# Patient Record
Sex: Male | Born: 1955 | Race: White | Hispanic: No | Marital: Married
Health system: Southern US, Community
[De-identification: ages and names within clinical notes are randomized; demographics above are authoritative.]

## PROBLEM LIST (undated history)

## (undated) ENCOUNTER — Encounter

## (undated) ENCOUNTER — Encounter: Attending: Gastroenterology | Primary: Gastroenterology

## (undated) ENCOUNTER — Ambulatory Visit: Attending: Surgery | Primary: Surgery

## (undated) ENCOUNTER — Telehealth

## (undated) ENCOUNTER — Ambulatory Visit: Payer: BLUE CROSS/BLUE SHIELD

## (undated) ENCOUNTER — Ambulatory Visit: Payer: PRIVATE HEALTH INSURANCE | Attending: Gastroenterology | Primary: Gastroenterology

## (undated) ENCOUNTER — Ambulatory Visit

## (undated) ENCOUNTER — Ambulatory Visit
Attending: Student in an Organized Health Care Education/Training Program | Primary: Student in an Organized Health Care Education/Training Program

## (undated) ENCOUNTER — Encounter
Attending: Student in an Organized Health Care Education/Training Program | Primary: Student in an Organized Health Care Education/Training Program

## (undated) ENCOUNTER — Telehealth
Attending: Student in an Organized Health Care Education/Training Program | Primary: Student in an Organized Health Care Education/Training Program

## (undated) ENCOUNTER — Ambulatory Visit: Payer: PRIVATE HEALTH INSURANCE

## (undated) ENCOUNTER — Ambulatory Visit: Payer: BLUE CROSS/BLUE SHIELD | Attending: Gastroenterology | Primary: Gastroenterology

---

## 2018-03-01 ENCOUNTER — Emergency Department: Payer: BLUE CROSS/BLUE SHIELD

## 2018-03-01 ENCOUNTER — Emergency Department
Admission: EM | Admit: 2018-03-01 | Discharge: 2018-03-01 | Disposition: A | Discharge status: Home / Self Care | Payer: BLUE CROSS/BLUE SHIELD | Attending: Emergency Medicine | Admitting: Emergency Medicine

## 2018-03-01 ENCOUNTER — Other Ambulatory Visit: Payer: Self-pay

## 2018-03-01 DIAGNOSIS — R42 Dizziness and giddiness: Secondary | ICD-10-CM | POA: Diagnosis not present

## 2018-03-01 LAB — CBC WITH DIFFERENTIAL/PLATELET
BASOS ABS: 0.1 10*3/uL (ref 0–0.1)
BASOS PCT: 1 %
EOS ABS: 0.3 10*3/uL (ref 0–0.7)
Eosinophils Relative: 2 %
HCT: 43 % (ref 40.0–52.0)
HEMOGLOBIN: 14.9 g/dL (ref 13.0–18.0)
Lymphocytes Relative: 27 %
Lymphs Abs: 4.3 10*3/uL — ABNORMAL HIGH (ref 1.0–3.6)
MCH: 32.5 pg (ref 26.0–34.0)
MCHC: 34.5 g/dL (ref 32.0–36.0)
MCV: 94.2 fL (ref 80.0–100.0)
MONOS PCT: 8 %
Monocytes Absolute: 1.4 10*3/uL — ABNORMAL HIGH (ref 0.2–1.0)
NEUTROS PCT: 62 %
Neutro Abs: 10.2 10*3/uL — ABNORMAL HIGH (ref 1.4–6.5)
Platelets: 340 10*3/uL (ref 150–440)
RBC: 4.57 MIL/uL (ref 4.40–5.90)
RDW: 13.3 % (ref 11.5–14.5)
WBC: 16.2 10*3/uL — AB (ref 3.8–10.6)

## 2018-03-01 LAB — GLUCOSE, CAPILLARY: GLUCOSE-CAPILLARY: 110 mg/dL — AB (ref 70–99)

## 2018-03-01 LAB — BASIC METABOLIC PANEL
ANION GAP: 7 (ref 5–15)
BUN: 18 mg/dL (ref 6–20)
CALCIUM: 9.1 mg/dL (ref 8.9–10.3)
CO2: 25 mmol/L (ref 22–32)
CREATININE: 0.99 mg/dL (ref 0.61–1.24)
Chloride: 107 mmol/L (ref 98–111)
GFR calc non Af Amer: >60 mL/min (ref >60)
Glucose, Bld: 125 mg/dL — ABNORMAL HIGH (ref 70–99)
Potassium: 3.7 mmol/L (ref 3.5–5.1)
SODIUM: 139 mmol/L (ref 135–145)

## 2018-03-01 LAB — TROPONIN I

## 2018-03-01 MED ORDER — ONDANSETRON 4 MG PO TBDP: 4.0000 mg | ORAL_TABLET | Freq: Three times a day (TID) | ORAL | 0 refills | Status: AC | PRN

## 2018-03-01 MED ORDER — DIAZEPAM 5 MG/ML IJ SOLN: 2.5000 mg | Freq: Once | INTRAMUSCULAR | Status: DC

## 2018-03-01 MED ORDER — MECLIZINE HCL 25 MG PO TABS: 25.0000 mg | ORAL_TABLET | Freq: Three times a day (TID) | ORAL | 0 refills | Status: AC | PRN

## 2018-03-01 MED ORDER — MECLIZINE HCL 25 MG PO TABS
25.0000 mg | ORAL_TABLET | Freq: Once | ORAL | Status: AC
  Administered 2018-03-01: 25 mg via ORAL
  Filled 2018-03-01: qty 1

## 2018-03-01 MED ORDER — ONDANSETRON HCL 4 MG/2ML IJ SOLN
INTRAMUSCULAR | Status: AC
  Administered 2018-03-01: 4 mg
  Filled 2018-03-01: qty 2

## 2018-03-01 MED ORDER — SODIUM CHLORIDE 0.9 % IV BOLUS
1000.0000 mL | Freq: Once | INTRAVENOUS | Status: AC
  Administered 2018-03-01: 1000 mL via INTRAVENOUS

## 2018-03-01 MED ORDER — DIAZEPAM 2 MG PO TABS
2.0000 mg | ORAL_TABLET | Freq: Once | ORAL | Status: AC
  Administered 2018-03-01: 2 mg via ORAL
  Filled 2018-03-01: qty 1

## 2018-03-01 NOTE — ED Notes (Signed)
Pt up and ambulated with no difficulty or dizziness at this time.

## 2018-03-01 NOTE — ED Provider Notes (Signed)
[facility] Emergency Department Provider Note  ____________________________________________  Time seen: Approximately 7:21 PM  I have reviewed the triage vital signs and the nursing notes.   HISTORY  Chief Complaint Dizziness   HPI Aaron Saunders is a 62 y.o. male with history of Crohn's disease on monthly Remicade infusions who presents for evaluation of dizziness.  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  While driving he developed sudden onset of room spinning sensation associated with nausea, several episodes of vomiting, and severe diaphoresis.  Patient denies headache or chest pain, shortness of breath or palpitations.  Patient reports similar episode on July 1 which led to a 2-day hospitalization in [place].  Patient underwent carotid ultrasound, MRI, CT, cardiac evaluation, and Holter monitoring all which were within normal limits.  Patient does have a history of left-sided sensorineural hearing loss and his symptoms were attributed to vertigo.  Patient reports history of tinnitus however his symptoms have been worse over the last month on the left side.  He denies dysarthria, dysphasia, diplopia, unilateral weakness or numbness.  At this time he continues to complain of dizziness and reports that he feels like being on a boat and off balance.  Patient denies history of smoking.  No family history of stroke or heart disease.  PMH Crohn's disease  Allergies Amoxicillin and Sulfur  FH Cancer - mother  Social History Smoking - never Alcohol - occasional Drugs - occasional cannabis  Review of Systems  Constitutional: Negative for fever. Eyes: Negative for visual changes. ENT: Negative for sore throat. Neck: No neck pain  Cardiovascular: Negative for chest pain. Respiratory: Negative for shortness of breath. Gastrointestinal: Negative for abdominal pain,  vomiting or diarrhea. Genitourinary: Negative for dysuria. Musculoskeletal: Negative for back pain. Skin: Negative for rash. Neurological: Negative for headaches, weakness or numbness. + vertigo Psych: No SI or HI  ____________________________________________   PHYSICAL EXAM:  VITAL SIGNS: ED Triage Vitals [03/01/18 1856]  Enc Vitals Group     BP 117/68     Pulse Rate 67     Resp      Temp 97.6 F (36.4 C)     Temp Source Oral     SpO2 96 %     Weight 210 lb (95.3 kg)     Height 6\' 2"  (1.88 m)     Head Circumference      Peak Flow      Pain Score 0     Pain Loc      Pain Edu?      Excl. in GC?     Constitutional: Alert and oriented. Well appearing and in no apparent distress. HEENT:      Head: Normocephalic and atraumatic.         Eyes: Conjunctivae are normal. Sclera is non-icteric.        Ears: TMs visualized bilaterally      Mouth/Throat: Mucous membranes are moist.       Neck: Supple with no signs of meningismus. Cardiovascular: Regular rate and rhythm. No murmurs, gallops, or rubs. 2+ symmetrical distal pulses are present in all extremities. No JVD. Respiratory: Normal respiratory effort. Lungs are clear to auscultation bilaterally. No wheezes, crackles, or rhonchi.  Gastrointestinal: Soft, non tender, and non distended with positive bowel sounds. No rebound or guarding. Musculoskeletal: Nontender with normal range of motion in all extremities. No edema, cyanosis, or erythema  of extremities. Neurologic: Normal speech and language. A & O x3, PERRL, EOMI, unidirectional horizontal nystagmus, CN II-XII intact, motor testing reveals good tone and bulk throughout. There is no evidence of pronator drift or dysmetria. Muscle strength is 5/5 throughout. Sensory examination is intact. Gait is ataxic. Skin: Skin is warm, dry and intact. No rash noted. Psychiatric: Mood and affect are normal. Speech and behavior are normal.  ____________________________________________     LABS (all labs ordered are listed, but only abnormal results are displayed)  Labs Reviewed  GLUCOSE, CAPILLARY - Abnormal; Notable for the following components:      Result Value   Glucose-Capillary 110 (*)    All other components within normal limits  CBC WITH DIFFERENTIAL/PLATELET - Abnormal; Notable for the following components:   WBC 16.2 (*)    Neutro Abs 10.2 (*)    Lymphs Abs 4.3 (*)    Monocytes Absolute 1.4 (*)    All other components within normal limits  BASIC METABOLIC PANEL - Abnormal; Notable for the following components:   Glucose, Bld 125 (*)    All other components within normal limits  TROPONIN I  TROPONIN I   ____________________________________________  EKG  ED ECG REPORT I, Nita Sickle, the attending physician, personally viewed and interpreted this ECG.  Normal sinus rhythm, rate of 75, right bundle branch block, normal QTC, normal axis, no ST elevations or depressions.  No prior for comparison. ____________________________________________  RADIOLOGY  I have personally reviewed the images performed during this visit and I agree with the Radiologist's read.   Interpretation by Radiologist:  Ct Head Wo Contrast  Result Date: 03/01/2018 CLINICAL DATA:  Diaphoresis.  Dizziness, nausea, and vomiting. EXAM: CT HEAD WITHOUT CONTRAST TECHNIQUE: Contiguous axial images were obtained from the base of the skull through the vertex without intravenous contrast. COMPARISON:  None. FINDINGS: Brain: No evidence of acute infarction, hemorrhage, hydrocephalus, extra-axial collection or mass lesion/mass effect. Vascular: No hyperdense vessel or unexpected calcification. Skull: Normal. Negative for fracture or focal lesion. Sinuses/Orbits: No acute finding. Other: None. IMPRESSION: No acute intracranial abnormalities. Electronically Signed   By: Gerome Sam III M.D   On: 03/01/2018 19:53       ____________________________________________   PROCEDURES  Procedure(s) performed: None Procedures Critical Care performed:  None ____________________________________________   INITIAL IMPRESSION / ASSESSMENT AND PLAN / ED COURSE   62 y.o. male with history of Crohn's disease on monthly Remicade infusions who presents for evaluation of dizziness.  Patient with sudden onset of vertigo in the setting of worsening tinnitus for the last month.  Patient recently underwent extensive evaluation including MRI, CT head, carotid Doppler studies, EKG and Holter monitoring which were all unremarkable.  He has a history of sensorineural hearing loss.  Symptoms started suddenly while driving from [place].  Patient is otherwise neurologically intact.  With ambulation is slightly ataxic due to vertigo.  EKG with no evidence of ischemia or dysrhythmias.  Patient has a right bundle branch block seen on EKG but no prior for comparison.  Will check basic labs, give meclizine, Valium, IV fluids and Zofran.  Will do head CT.  Clinical Course as of Mar 01 2316  Sat Mar 01, 2018  2315 Troponin x2-, head CT negative, labs showing leukocytosis of unclear etiology most likely stress-induced from severe vomiting.  Patient is able to ambulate with normal gait, remains completely neurologically intact.  Due to recent evaluation including MRI and MRA which were both negative I do not believe patient warrants  an MRI at this time.  This is most likely peripheral vertigo.  I will prescribe patient Zofran and meclizine for recurrence of his symptoms.  Recommend follow-up with his ENT.  Discussed return precautions for any signs or symptoms of stroke.   [CV]    Clinical Course User Index [CV] Don PerkingVeronese, [place], MD     As part of my medical decision making, I reviewed the following data within the electronic MEDICAL RECORD NUMBER Nursing notes reviewed and incorporated, Labs reviewed , EKG interpreted , Old chart reviewed,  Radiograph reviewed , Notes from prior ED visits and  Controlled Substance Database    Pertinent labs & imaging results that were available during my care of the patient were reviewed by me and considered in my medical decision making (see chart for details).    ____________________________________________   FINAL CLINICAL IMPRESSION(S) / ED DIAGNOSES  Final diagnoses:  Vertigo      NEW MEDICATIONS STARTED DURING THIS VISIT:  ED Discharge Orders         Ordered    meclizine (ANTIVERT) 25 MG tablet  3 times daily PRN     03/01/18 2317    ondansetron (ZOFRAN ODT) 4 MG disintegrating tablet  Every 8 hours PRN     03/01/18 2317           Note:  This document was prepared using Dragon voice recognition software and may include unintentional dictation errors.    Nita SickleVeronese, [place], MD 03/01/18 [id number]

## 2018-03-01 NOTE — ED Triage Notes (Signed)
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 Pt cont obe profusely diaphoretic and nauseated with the dizziness. fsbs of 133 for ems, right bbb on ekg for ems, pt reports recent episode similar to this a month ago and was worked up with scans and blood work. Pt reports only hx is chron's that he gets an infusion of remicaid

## 2019-12-26 IMAGING — CT CT HEAD W/O CM
3 series · 16 of 47 positions shown, 19 images · non-contrast
Comparison: None.

CLINICAL DATA: Diaphoresis.  Dizziness, nausea, and vomiting.

EXAM:
CT HEAD WITHOUT CONTRAST
TECHNIQUE: Contiguous axial images were obtained from the base of the skull
through the vertex without intravenous contrast.

[Series 2: head wo · axial · 0.47mm/px · z∈[-172,-42]mm · 10 of 32 slices shown, 13 images]
[im 3/32  brain]
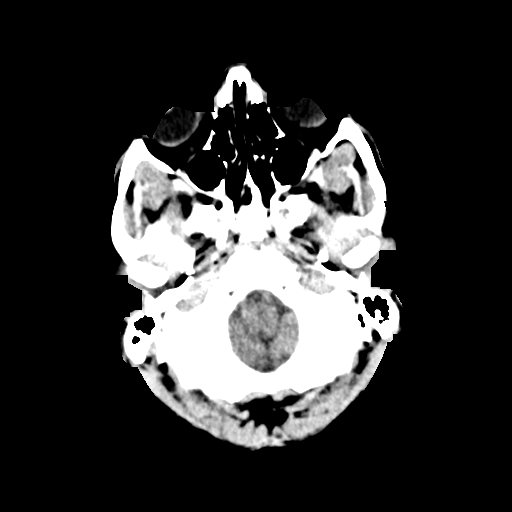
[im 3/32  bone]
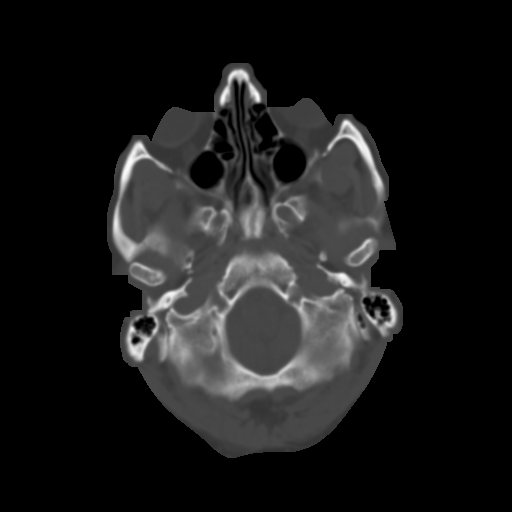
[im 6/32  brain]
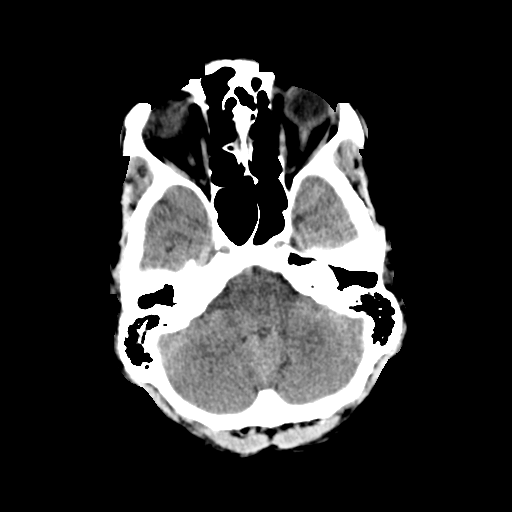
[im 9/32  brain]
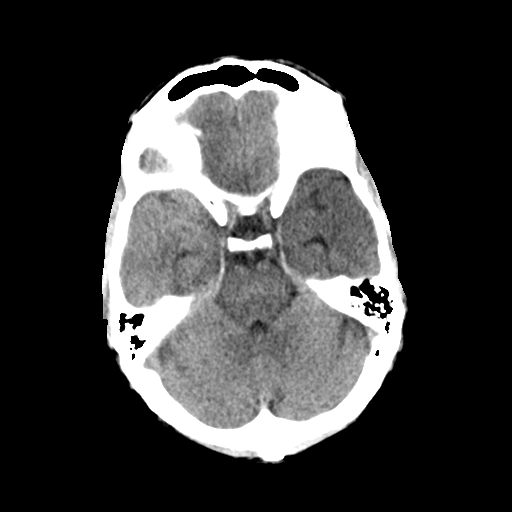
[im 11/32  brain]
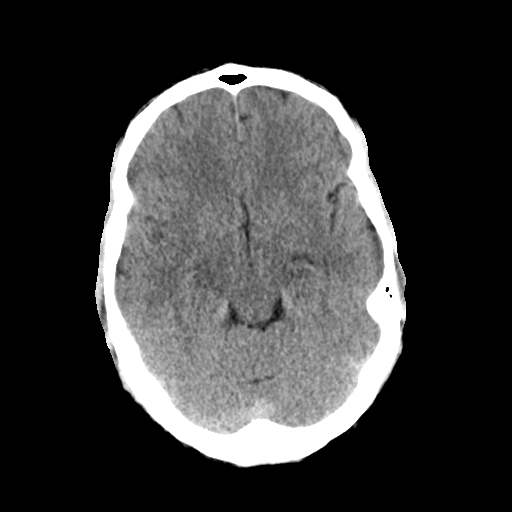
[im 14/32  brain]
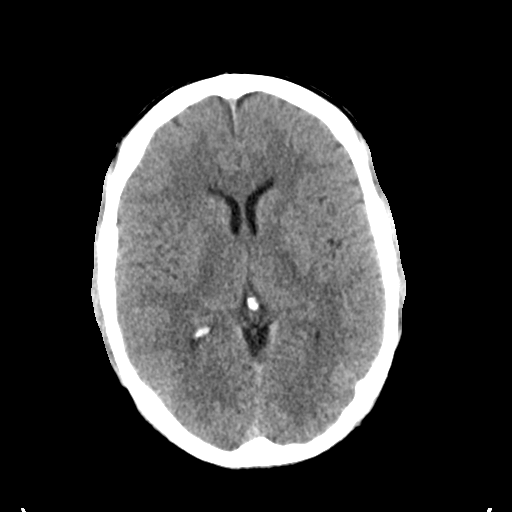
[im 14/32  bone]
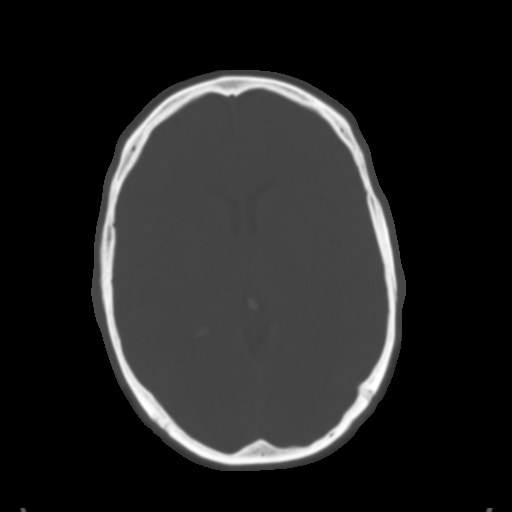
[im 18/32  brain]
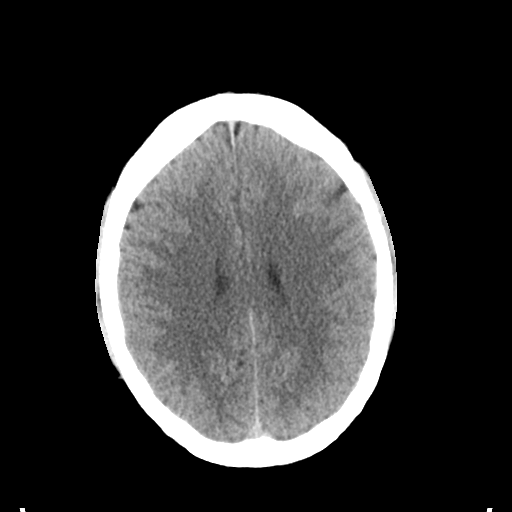
[im 21/32  brain]
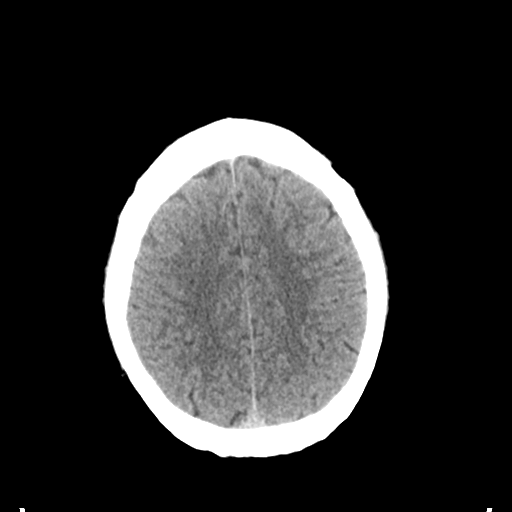
[im 24/32  brain]
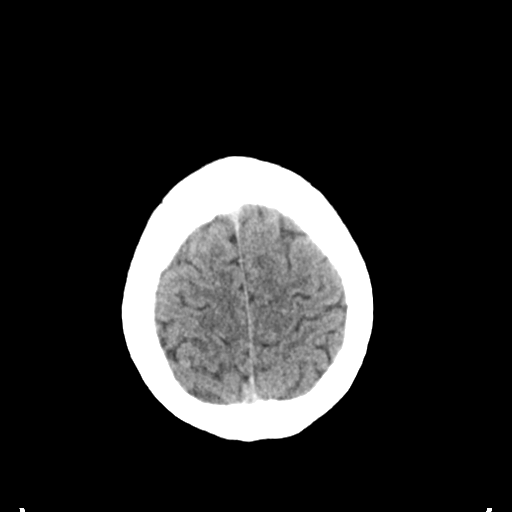
[im 26/32  brain]
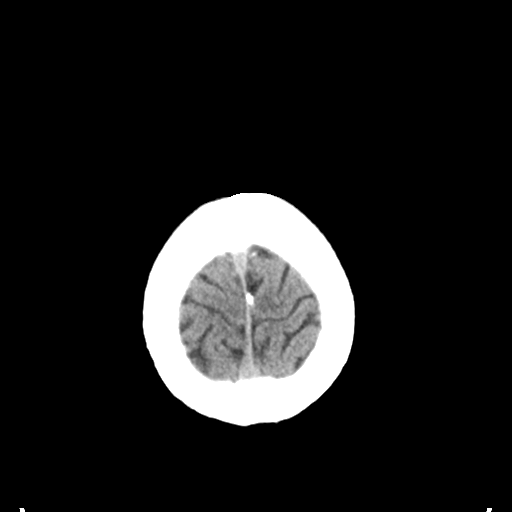
[im 26/32  bone]
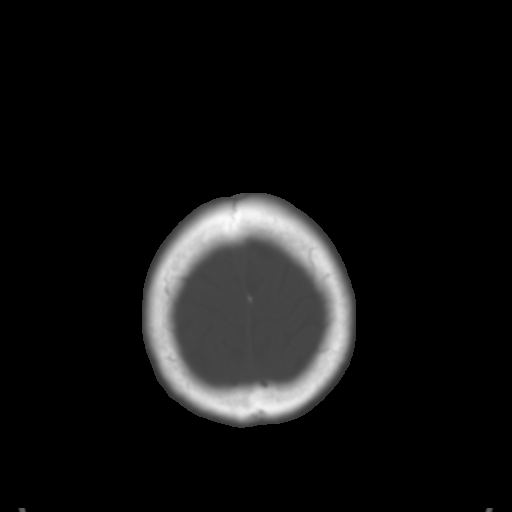
[im 29/32  brain]
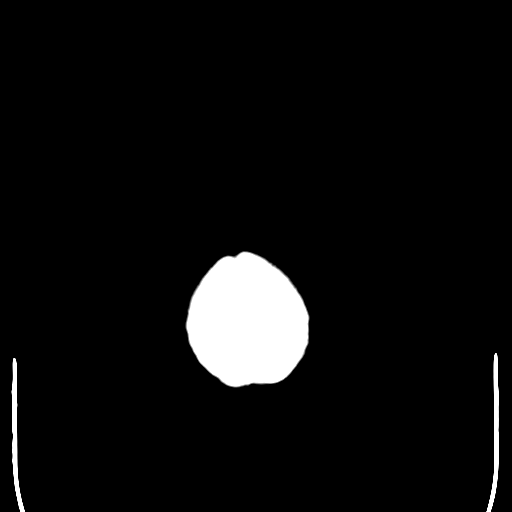

[Series 4: coronal soft tissue · coronal · 0.31mm/px · 3 of 67 slices shown]
[im 23/67  brain]
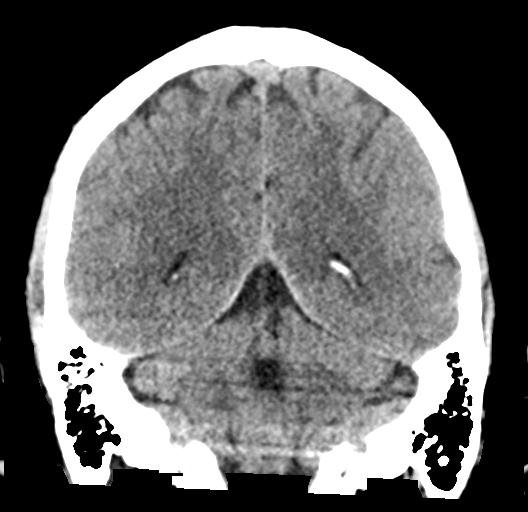
[im 30/67  brain]
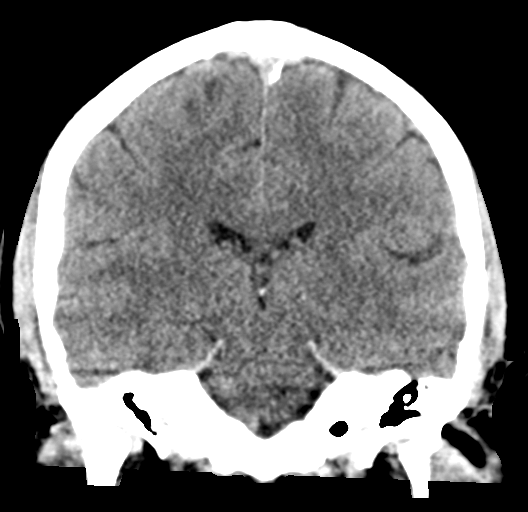
[im 37/67  brain]
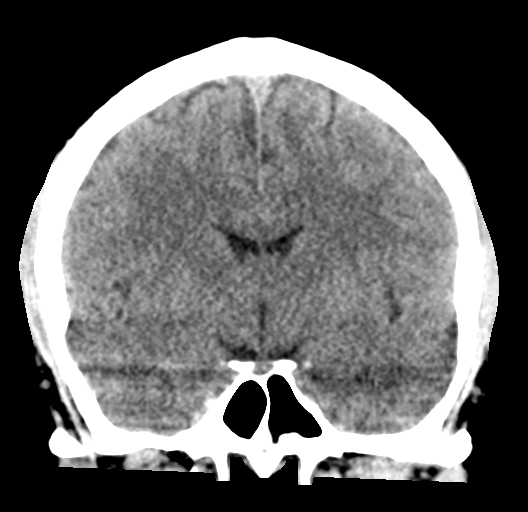

[Series 5: sagittal soft tissue · sagittal · 0.31mm/px · 3 of 57 slices shown]
[im 19/57  brain]
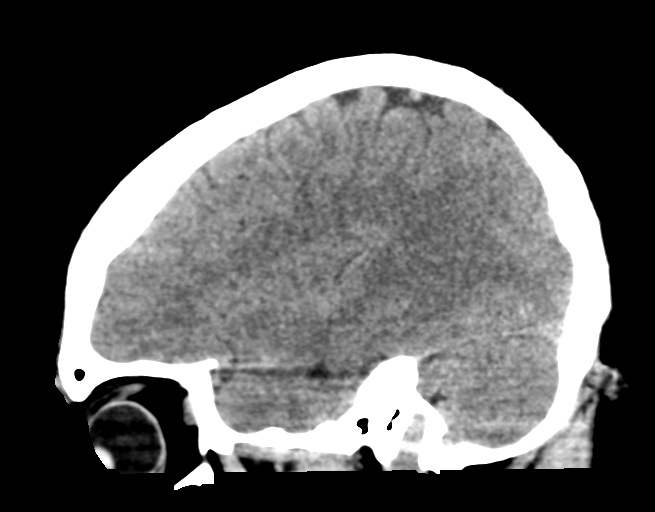
[im 29/57  brain]
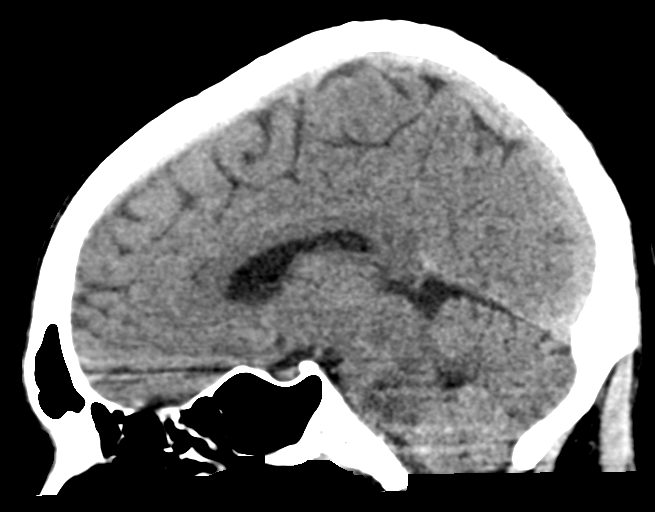
[im 38/57  brain]
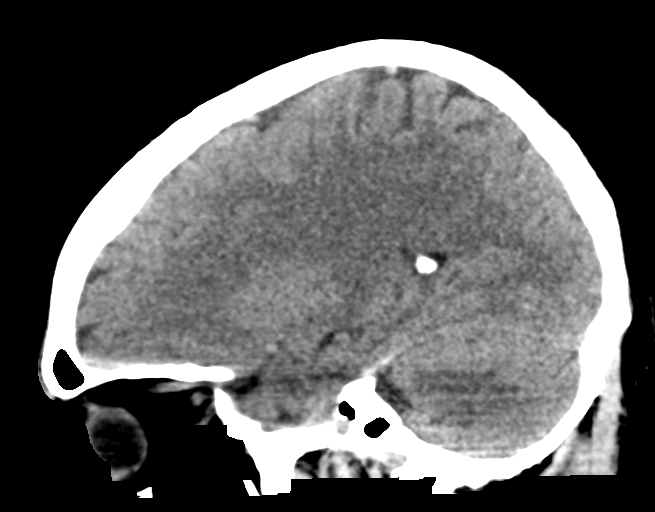

[16 of 47 positions shown; findings below may reference images not displayed]

FINDINGS: Brain: No evidence of acute infarction, hemorrhage, hydrocephalus,
extra-axial collection or mass lesion/mass effect.

Vascular: No hyperdense vessel or unexpected calcification.

Skull: Normal. Negative for fracture or focal lesion.

Sinuses/Orbits: No acute finding.

Other: None.
IMPRESSION: No acute intracranial abnormalities.

## 2021-02-21 ENCOUNTER — Encounter: Admit: 2021-02-21 | Discharge: 2021-02-22 | Payer: PRIVATE HEALTH INSURANCE

## 2021-02-21 DIAGNOSIS — L988 Other specified disorders of the skin and subcutaneous tissue: Principal | ICD-10-CM

## 2021-02-21 DIAGNOSIS — K50119 Crohn's disease of large intestine with unspecified complications: Principal | ICD-10-CM

## 2021-02-21 DIAGNOSIS — K50919 Crohn's disease, unspecified, with unspecified complications: Principal | ICD-10-CM

## 2021-02-28 ENCOUNTER — Encounter: Admit: 2021-02-28 | Discharge: 2021-03-01 | Payer: PRIVATE HEALTH INSURANCE

## 2021-02-28 DIAGNOSIS — L988 Other specified disorders of the skin and subcutaneous tissue: Principal | ICD-10-CM

## 2021-02-28 DIAGNOSIS — K50119 Crohn's disease of large intestine with unspecified complications: Principal | ICD-10-CM

## 2021-02-28 DIAGNOSIS — R0602 Shortness of breath: Principal | ICD-10-CM

## 2021-03-06 DIAGNOSIS — K50119 Crohn's disease of large intestine with unspecified complications: Principal | ICD-10-CM

## 2021-03-06 MED ORDER — POLYETHYLENE GLYCOL 3350 17 GRAM ORAL POWDER PACKET
PACK | Freq: Once | ORAL | 0 refills | 1 days | Status: CP
Start: 2021-03-06 — End: 2021-03-06

## 2021-03-06 MED ORDER — ERYTHROMYCIN 250 MG TABLET
ORAL_TABLET | 0 refills | 0 days | Status: CP
Start: 2021-03-06 — End: ?

## 2021-03-06 MED ORDER — BISACODYL 5 MG TABLET,DELAYED RELEASE
ORAL_TABLET | Freq: Once | ORAL | 0 refills | 1 days | Status: CP
Start: 2021-03-06 — End: 2021-03-06

## 2021-03-06 MED ORDER — NEOMYCIN 500 MG TABLET
ORAL_TABLET | 0 refills | 0 days | Status: CP
Start: 2021-03-06 — End: ?

## 2021-03-08 DIAGNOSIS — K50119 Crohn's disease of large intestine with unspecified complications: Principal | ICD-10-CM

## 2021-03-16 ENCOUNTER — Ambulatory Visit: Admit: 2021-03-16 | Discharge: 2021-03-16 | Payer: PRIVATE HEALTH INSURANCE

## 2021-03-16 ENCOUNTER — Encounter
Admit: 2021-03-16 | Discharge: 2021-03-16 | Payer: PRIVATE HEALTH INSURANCE | Attending: Certified Registered" | Primary: Certified Registered"

## 2021-03-17 ENCOUNTER — Encounter: Admit: 2021-03-17 | Payer: PRIVATE HEALTH INSURANCE

## 2021-03-17 ENCOUNTER — Encounter: Admit: 2021-03-17 | Discharge: 2021-03-23 | Payer: PRIVATE HEALTH INSURANCE

## 2021-03-17 ENCOUNTER — Ambulatory Visit
Admit: 2021-03-17 | Discharge: 2021-03-23 | Disposition: A | Discharge status: Home / Self Care | Payer: PRIVATE HEALTH INSURANCE

## 2021-03-23 MED ORDER — LOPERAMIDE 2 MG CAPSULE
ORAL_CAPSULE | Freq: Three times a day (TID) | ORAL | 1 refills | 30.00000 days | Status: CP
Start: 2021-03-23 — End: 2021-04-22
  Filled 2021-03-23: qty 90, 12d supply, fill #0

## 2021-03-23 MED ORDER — OXYCODONE 5 MG TABLET
ORAL_TABLET | ORAL | 0 refills | 0.00000 days | Status: CP
Start: 2021-03-23 — End: ?
  Filled 2021-03-23: qty 15, 4d supply, fill #0

## 2021-03-24 MED ORDER — ENOXAPARIN 40 MG/0.4 ML SUBCUTANEOUS SYRINGE
SUBCUTANEOUS | 0 refills | 23.00000 days | Status: CP
Start: 2021-03-24 — End: 2021-04-16
  Filled 2021-03-23: qty 9.2, 23d supply, fill #0

## 2021-04-04 ENCOUNTER — Institutional Professional Consult (permissible substitution): Admit: 2021-04-04 | Discharge: 2021-04-04 | Payer: PRIVATE HEALTH INSURANCE

## 2021-04-04 ENCOUNTER — Ambulatory Visit: Admit: 2021-04-04 | Discharge: 2021-04-04 | Payer: PRIVATE HEALTH INSURANCE

## 2021-04-10 MED ORDER — LOPERAMIDE 2 MG CAPSULE
ORAL_CAPSULE | Freq: Three times a day (TID) | ORAL | 11 refills | 30 days | Status: CP
Start: 2021-04-10 — End: 2021-05-10

## 2021-05-04 ENCOUNTER — Ambulatory Visit
Admit: 2021-05-04 | Discharge: 2021-05-05 | Payer: PRIVATE HEALTH INSURANCE | Attending: Gastroenterology | Primary: Gastroenterology

## 2021-05-04 DIAGNOSIS — K50013 Crohn's disease of small intestine with fistula: Principal | ICD-10-CM

## 2021-05-16 ENCOUNTER — Ambulatory Visit: Admit: 2021-05-16 | Discharge: 2021-05-16 | Payer: PRIVATE HEALTH INSURANCE

## 2021-05-16 DIAGNOSIS — K50013 Crohn's disease of small intestine with fistula: Principal | ICD-10-CM

## 2021-05-16 MED ORDER — NYSTATIN 100,000 UNIT/GRAM TOPICAL POWDER
Freq: Four times a day (QID) | TOPICAL | 0 refills | 8 days | Status: CP
Start: 2021-05-16 — End: 2022-05-16

## 2021-06-23 MED ORDER — LOPERAMIDE 2 MG CAPSULE
ORAL_CAPSULE | Freq: Four times a day (QID) | ORAL | 2 refills | 30 days | Status: CP
Start: 2021-06-23 — End: 2021-07-23

## 2021-07-03 ENCOUNTER — Ambulatory Visit
Admit: 2021-07-03 | Discharge: 2021-07-06 | Disposition: A | Discharge status: Home / Self Care | Payer: PRIVATE HEALTH INSURANCE

## 2021-07-03 ENCOUNTER — Encounter: Admit: 2021-07-03 | Discharge: 2021-07-06 | Payer: PRIVATE HEALTH INSURANCE

## 2021-07-06 MED ORDER — ONDANSETRON 4 MG DISINTEGRATING TABLET
ORAL_TABLET | Freq: Three times a day (TID) | ORAL | 0 refills | 7 days | Status: CP | PRN
Start: 2021-07-06 — End: 2021-07-13
  Filled 2021-07-06: qty 20, 7d supply, fill #0

## 2021-07-06 MED ORDER — OXYCODONE 5 MG TABLET
ORAL_TABLET | 0 refills | 0 days | Status: CP
Start: 2021-07-06 — End: ?
  Filled 2021-07-06: qty 15, 3d supply, fill #0

## 2021-07-26 ENCOUNTER — Ambulatory Visit: Admit: 2021-07-26 | Discharge: 2021-07-27 | Payer: PRIVATE HEALTH INSURANCE

## 2021-08-03 ENCOUNTER — Ambulatory Visit
Admit: 2021-08-03 | Discharge: 2021-08-04 | Payer: PRIVATE HEALTH INSURANCE | Attending: Gastroenterology | Primary: Gastroenterology

## 2021-08-03 ENCOUNTER — Ambulatory Visit: Admit: 2021-08-03 | Discharge: 2021-08-04 | Payer: PRIVATE HEALTH INSURANCE

## 2021-08-03 DIAGNOSIS — K50013 Crohn's disease of small intestine with fistula: Principal | ICD-10-CM

## 2021-08-03 MED ORDER — COLESTIPOL 1 GRAM TABLET
ORAL_TABLET | 11 refills | 0.00000 days | Status: CP
Start: 2021-08-03 — End: ?

## 2021-08-23 ENCOUNTER — Ambulatory Visit: Admit: 2021-08-23 | Discharge: 2021-08-23 | Payer: PRIVATE HEALTH INSURANCE

## 2021-08-23 DIAGNOSIS — Z9889 Other specified postprocedural states: Principal | ICD-10-CM

## 2021-09-25 DIAGNOSIS — K439 Ventral hernia without obstruction or gangrene: Principal | ICD-10-CM

## 2021-10-16 MED ORDER — COLESTIPOL 1 GRAM TABLET
ORAL_TABLET | 8 refills | 0 days | Status: CP
Start: 2021-10-16 — End: ?

## 2021-10-17 MED ORDER — COLESTIPOL 1 GRAM TABLET
ORAL_TABLET | 8 refills | 0 days | Status: CP
Start: 2021-10-17 — End: ?

## 2021-10-19 MED ORDER — COLESEVELAM 625 MG TABLET
ORAL_TABLET | Freq: Two times a day (BID) | ORAL | 11 refills | 30 days | Status: CP
Start: 2021-10-19 — End: 2022-10-19

## 2021-10-20 DIAGNOSIS — K50013 Crohn's disease of small intestine with fistula: Principal | ICD-10-CM

## 2021-11-02 ENCOUNTER — Ambulatory Visit: Admit: 2021-11-02 | Discharge: 2021-11-02 | Payer: PRIVATE HEALTH INSURANCE

## 2021-11-02 DIAGNOSIS — K50013 Crohn's disease of small intestine with fistula: Principal | ICD-10-CM

## 2021-11-02 DIAGNOSIS — K219 Gastro-esophageal reflux disease without esophagitis: Principal | ICD-10-CM

## 2021-11-02 DIAGNOSIS — K50919 Crohn's disease, unspecified, with unspecified complications: Principal | ICD-10-CM

## 2021-11-02 DIAGNOSIS — K7689 Other specified diseases of liver: Principal | ICD-10-CM

## 2021-11-02 DIAGNOSIS — K439 Ventral hernia without obstruction or gangrene: Principal | ICD-10-CM

## 2021-11-02 DIAGNOSIS — Z9989 Dependence on other enabling machines and devices: Principal | ICD-10-CM

## 2021-11-02 DIAGNOSIS — G4733 Obstructive sleep apnea (adult) (pediatric): Principal | ICD-10-CM

## 2021-11-14 ENCOUNTER — Ambulatory Visit: Admit: 2021-11-14 | Discharge: 2021-11-15 | Payer: PRIVATE HEALTH INSURANCE

## 2021-11-14 ENCOUNTER — Encounter
Admit: 2021-11-14 | Discharge: 2021-11-15 | Payer: PRIVATE HEALTH INSURANCE | Attending: Student in an Organized Health Care Education/Training Program | Primary: Student in an Organized Health Care Education/Training Program

## 2021-11-14 DIAGNOSIS — K6289 Other specified diseases of anus and rectum: Principal | ICD-10-CM

## 2021-11-14 MED ORDER — ONDANSETRON 4 MG DISINTEGRATING TABLET
ORAL_TABLET | Freq: Four times a day (QID) | ORAL | 0 refills | 7.00000 days | Status: CP | PRN
Start: 2021-11-14 — End: 2021-11-21
  Filled 2021-11-14: qty 28, 7d supply, fill #0

## 2021-11-14 MED ORDER — OXYCODONE 5 MG TABLET
ORAL_TABLET | ORAL | 0 refills | 2.00000 days | Status: CP | PRN
Start: 2021-11-14 — End: 2021-11-19
  Filled 2021-11-14: qty 10, 2d supply, fill #0

## 2021-11-21 MED ORDER — PREDNISONE 10 MG TABLET
ORAL_TABLET | ORAL | 0 refills | 49 days | Status: CP
Start: 2021-11-21 — End: 2022-01-09

## 2021-11-30 ENCOUNTER — Ambulatory Visit
Admit: 2021-11-30 | Discharge: 2021-12-01 | Payer: PRIVATE HEALTH INSURANCE | Attending: Gastroenterology | Primary: Gastroenterology

## 2021-11-30 DIAGNOSIS — K50013 Crohn's disease of small intestine with fistula: Principal | ICD-10-CM

## 2021-11-30 DIAGNOSIS — K610 Anal abscess: Principal | ICD-10-CM

## 2021-11-30 MED ORDER — OXYCODONE 5 MG TABLET
ORAL_TABLET | ORAL | 0 refills | 5 days | Status: CP | PRN
Start: 2021-11-30 — End: ?

## 2021-11-30 MED ORDER — LEVOFLOXACIN 500 MG TABLET
ORAL_TABLET | Freq: Every day | ORAL | 0 refills | 14 days | Status: CP
Start: 2021-11-30 — End: 2021-12-14

## 2021-12-12 MED ORDER — OXYCODONE-ACETAMINOPHEN 10 MG-325 MG TABLET
ORAL_TABLET | ORAL | 0 refills | 5 days | Status: CP | PRN
Start: 2021-12-12 — End: 2021-12-17

## 2022-02-14 DIAGNOSIS — K50013 Crohn's disease of small intestine with fistula: Principal | ICD-10-CM

## 2022-02-14 MED ORDER — SKYRIZI 360 MG/2.4 ML (150 MG/ML) SUBCUTANEOUS WEARABLE INJECTOR
SUBCUTANEOUS | 2 refills | 0 days | Status: CP
Start: 2022-02-14 — End: ?
  Filled 2022-03-01: qty 2.4, 56d supply, fill #0

## 2022-02-16 NOTE — Unmapped (Signed)
[facility] Specialty Medication Onboarding    Specialty Medication: Skyrizi 360mg /2.81mL (150mg /mL) injection kit  Prior Authorization: Approved   Financial Assistance: No - copay  <$25  Final Copay/Day Supply: $0 / 56 days    Insurance Restrictions: None     Notes to Pharmacist: 360mg  with OBI at week 12 post completion of IV loading doses    The triage team has completed the benefits investigation and has determined that the patient is able to fill this medication at [facility]. Please contact the patient to complete the onboarding or follow up with the prescribing physician as needed.

## 2022-02-22 MED ORDER — EMPTY CONTAINER
2 refills | 0 days
Start: 2022-02-22 — End: ?

## 2022-02-22 NOTE — Unmapped (Signed)
[facility]   Patient Onboarding/Medication Counseling    Theodore Mitchell is a 66 y.o. male with Crohn's disease who I am counseling today on continuation of therapy.  I am speaking to the patient.    Was a Nurse, learning disability used for this call? No    Verified patient's date of birth / HIPAA.    Specialty medication(s) to be sent: Inflammatory Disorders: Skyrizi      Non-specialty medications/supplies to be sent: sharps kit?      Medications not needed at this time: n/a         Skyrizi (risankizumab)    Medication & Administration     Dosage: Crohn's disease: Inject 360mg  under the skin every 8 weeks starting 4 weeks after 3rd IV induction dose (Completed at weeks 0, 4, and 8)    Lab tests required prior to treatment initiation:  Tuberculosis: Tuberculosis screening resulted in a non-reactive Quantiferon TB Gold assay.    Administration:     Air traffic controller all supplies needed for injection on a clean, flat working surface: Plastic tray containing the On-body injector and prefilled cartridge, alcohol swab, sharps container, etc.  Remove unopened carton from the refrigerator and allow it to warm up to room temperature for at least 45 but no more than 90 minutes.  Look at the medication label - look for correct medication, correct dose, and check the expiration date  Remove the On-body injector and prefilled cartridge from plastic tray  Look at the On-body injector - check that it is intact and undamaged. Do NOT close the gray door before the prefilled cartridge is loaded  Look at the prefilled cartridge - the liquid should appear clear and colorless to slightly yellow, you may see tiny white or clear particles. Do NOT twist or remove cartridge top.  Use an alcohol swab to clean the smaller bottom tip of the prefilled cartridge and let it air dry. Do not touch the smaller bottom tip after cleaning.  Insert the smaller bottom tip into the On-Body injector first. Firmly push down until you hear a click. There may be a few drops of medicine on the back of the On-body Injector. That is normal. Close the gray door and squeeze firmly until it snaps closed.   You MUST start the injection within 5 minutes of loading the prefilled cartridge.   Select injection site - you can use the front of your thigh or your belly (but not the area 2 inches around your belly button)  Prepare injection site - wash your hands and clean the skin at the injection site with an alcohol swab and let it air dry, do not touch the injection site again before the injection  Peel the green tabs on the back of the On-body injector to expose the adhesive. This will activate the On-body injector and cause the status light to turn BLUE.   For the belly, move and hold the skin to create a firm, flat surface. You do not need to pull the skin if injecting on the front of the thighs.  When the blue light flashes, it is ready to start the injection.  Place the On-body injector onto the cleaned skin and then firmly press the gray button until you hear a click. This will start the injection and the light will continuously flash GREEN. This may take up to 5 minutes to complete the full dose.  The On-body Injector will automatically stop when the injection is finished. You will  hear beeps and the status light will change to SOLID GREEN.   Remove the On-Body Injector by carefully peeling the adhesive from your skin. Avoid touching the needle or needle cover on the back of the On-Body Injector. You will hear several beeps and the status light will turn off.   Dispose of the used On-Body Injector immediately in Engineer, water.      Adherence/Missed dose instructions:  If your injection is given more than 7 days after your scheduled injection date - consult your pharmacist for additional instructions on how to adjust your dosing schedule.    Goals of Therapy     Crohn's Disease  Achieve remission of symptoms  Maintain remission of symptoms  Minimize long-term systemic glucocorticoid use  Prevent need for surgical procedures  Maintenance of effective psychosocial functioning      Side Effects & Monitoring Parameters     Injection site reaction (redness, irritation, inflammation localized to the site of administration)  Signs of a common cold - minor sore throat, runny or stuffy nose, etc.  Felling tired/weak  Headache  Stomach, joint or back pain    The following side effects should be reported to the provider:  Signs of a hypersensitivity reaction - rash; hives; itching; red, swollen, blistered, or peeling skin; wheezing; tightness in the chest or throat; difficulty breathing, swallowing, or talking; swelling of the mouth, face, lips, tongue, or throat; etc.  Reduced immune function - report signs of infection such as fever; chills; body aches; very bad sore throat; ear or sinus pain; cough; more sputum or change in color of sputum; pain with passing urine; wound that will not heal, etc.  Also at a slightly higher risk of some malignancies (mainly skin and blood cancers) due to this reduced immune function.  In the case of signs of infection - the patient should hold the next dose of Skyrizi?? and call your primary care provider to ensure adequate medical care.  Treatment may be resumed when infection is treated and patient is asymptomatic.  Flu-like symptoms  Warm, red, or painful skin or sores on the body  Severe diarrhea or stomach pain      Contraindications, Warnings, & Precautions     Have your bloodwork checked as you have been told by your prescriber  Talk with your doctor if you are pregnant, planning to become pregnant, or breastfeeding  Discuss the possible need for holding your dose(s) of Skyrizi?? when a planned procedure is scheduled with the prescriber as it may delay healing/recovery timeline       Drug/Food Interactions     Medication list reviewed in Epic. The patient was instructed to inform the care team before taking any new medications or supplements. No drug interactions identified.   Talk with you prescriber or pharmacist before receiving any live vaccinations while taking this medication and after you stop taking it    Storage, Handling Precautions, & Disposal     Store this medication in the refrigerator.  Do not freeze   If needed, you may store at room temperature for up to 24 hours  Store in original packaging, protected from light  Do not shake  Dispose of used syringes/pens in a sharps disposal container          Current Medications (including OTC/herbals), Comorbidities and Allergies     Current Outpatient Medications   Medication Sig Dispense Refill    acetaminophen (TYLENOL) 500 MG tablet Take 1-2 tablets (500 mg - 1 g  total) by mouth every 6 hours as needed for pain. Prescription not provided, medication is available over the counter. Take as directed on label 1 tablet 0    albuterol HFA 90 mcg/actuation inhaler       amitriptyline (ELAVIL) 100 MG tablet Take 1 tablet (100 mg total) by mouth nightly as needed.      colesevelam [facility]) 625 mg tablet Take 3 tablets (1,875 mg total) by mouth in the morning and 3 tablets (1,875 mg total) in the evening. Take with meals. 180 tablet 11    cyanocobalamin, vitamin B-12, 2,500 mcg Subl Place 1 tablet (2,500 mcg total) under the tongue daily.      esomeprazole (NEXIUM) 40 MG capsule Take 1 capsule (40 mg total) by mouth daily.      meclizine (ANTIVERT) 25 mg tablet Take 1 tablet (25 mg total) by mouth Three (3) times a day as needed for dizziness.      multivitamin with minerals (HAIR,SKIN AND NAILS) tablet Take 3 tablets by mouth daily. Pt. Dosages is Hair skin and nail 5000 mcg three tablet once a day.      oxyCODONE (ROXICODONE) 5 MG immediate release tablet Take 1 tablet (5 mg total) by mouth every four (4) hours as needed for pain for up to 30 doses. 30 tablet 0    potassium chloride (KLOR-CON) 8 MEQ CR tablet Take 1 tablet (8 mEq total) by mouth Two (2) times a day. Take as instructed by prescribing provider 1 tablet 0    risankizumab-rzaa (SKYRIZI) 360 mg/2.4 mL (150 mg/mL) Injt Inject the contents of 1 cartridge (360 mg total) under the skin every 8 weeks. Maintenance dose. 2.4 mL 2     No current facility-administered medications for this visit.       Allergies   Allergen Reactions    Amoxicillin     Sulfa (Sulfonamide Antibiotics)     Sulfamethoxazole      told by my mother not to take it. 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    Appropriateness of Therapy     Acute infections noted within Epic:  No active infections  Patient reported infection: None    Is medication and dose appropriate based on diagnosis and infection status? Yes    Prescription has been clinically reviewed: Yes      Baseline Quality of Life Assessment      How many days over the past month did your Crohn's disease  keep you from your normal activities? For example, brushing your teeth or getting up in the morning. 0    Financial Information     Medication Assistance provided: Prior Authorization    Anticipated copay of $0 reviewed with patient. Verified delivery address.    Delivery Information  Scheduled delivery date: 8/11    Expected start date: 8/17 (week 12)    Medication will be delivered via UPS to the prescription address in [facility].  This shipment will not require a signature.      Explained the services we provide at [facility] and that each month we would call to set up refills.  Stressed importance of returning phone calls so that we could ensure they receive their medications in time each month.  Informed patient that we should be setting up refills 7-10 days prior to when they will run out of medication.  A pharmacist will reach out to perform a clinical assessment periodically.  Informed patient that a welcome packet, containing information about our pharmacy and other support services, a Notice of Privacy Practices, and a drug information handout will be sent.      The patient or caregiver noted above participated in the development of this care plan and knows that they can request review of or adjustments to the care plan at any time.      Patient or caregiver verbalized understanding of the above information as well as how to contact the pharmacy at [phone] option 4 with any questions/concerns.  The pharmacy is open Monday through Friday 8:30am-4:30pm.  A pharmacist is available 24/7 via pager to answer any clinical questions they may have.    Patient Specific Needs     Does the patient have any physical, cognitive, or cultural barriers? No    Does the patient have adequate living arrangements? (i.e. the ability to store and take their medication appropriately) Yes    Did you identify any home environmental safety or security hazards? No    Patient prefers to have medications discussed with  Patient     Is the patient or caregiver able to read and understand education materials at a high school level or above? Yes    Patient's primary language is  English     Is the patient high risk? No    SOCIAL DETERMINANTS OF HEALTH     At the [facility], we have learned that life circumstances - like trouble affording food, housing, utilities, or transportation can affect the health of many of our patients.   That is why we wanted to ask: are you currently experiencing any life circumstances that are negatively impacting your health and/or quality of life? Patient declined to answer    Social Determinants of Health     Financial Resource Strain: Not on file   Internet Connectivity: Not on file   Food Insecurity: Not on file   Tobacco Use: Low Risk     Smoking Tobacco Use: Never    Smokeless Tobacco Use: Never    Passive Exposure: Not on file   Housing/Utilities: Not on file   Alcohol Use: Not on file   Transportation Needs: Not on file   Substance Use: Not on file   Health Literacy: Not on file   Physical Activity: Not on file   Interpersonal Safety: Not on file   Stress: Not on file   Intimate Partner Violence: Not on file   Depression: Not on file   Social Connections: Not on file       Would you be willing to receive help with any of the needs that you have identified today? Not applicable       Theodore Mitchell  [facility] Specialty Pharmacist

## 2022-02-26 NOTE — Unmapped (Signed)
Shipment will be sent from [facility] for delivery on 03/02/2022.  Will ensure with patient that he has had or will have training with skyrizi RN for 8/17 due date.  He will update prn. Herfarth follow up planned for September.

## 2022-03-01 MED FILL — EMPTY CONTAINER: 120 days supply | Qty: 1 | Fill #0

## 2022-03-19 NOTE — Unmapped (Signed)
Colonoscopy  Procedure #1   0  Procedure #2   [id number]  MRN   Herfarth   Endoscopist   FALSE  Is the patient's health insurance [address], [facility]), or [employer]?   FALSE  Urgent procedure   FALSE  Do you take: Plavix (clopidogrel), Coumadin (warfarin), Lovenox (enoxaparin), Pradaxa (dabigatran), Effient (prasugrel), Xarelto (rivaroxaban), Eliquis (apixaban), Pletal (cilostazol), or Brilinta (ticagrelor)?   FALSE  Do you have hemophilia, von Willebrand disease, thrombocytopenia?   FALSE  Do you have a pacemaker or implanted cardiac defibrillator?   FALSE  Are you pregnant?   FALSE  Has a [place] provider specified the location(s)?     Which location(s) did the [facility] provider specify?   FALSE     [facility]   FALSE     [place]   FALSE     [facility]of[facility]   FALSE     HMOB-Mod Sedation   FALSE  Is procedure indication for variceal banding (this does NOT include variceal screening)?        6  Height (feet)   2  Height (inches)   200  Weight (pounds)   25.7  BMI        FALSE  Did the ordering provider specify a bowel prep?   0       What bowel prep was specified?   FALSE  Do you have chronic kidney disease?   FALSE  Do you have chronic constipation or have you had poor quality bowel preps for past colonoscopies?   TRUE  Do you have Crohn's disease or ulcerative colitis?   FALSE  Have you had weight loss surgery?        FALSE  Are you in the process of scheduling or awaiting results of a heart ultrasound, stress test, or catheterization to evaluate new or worsening chest pain, dizziness, or shortness of breath?   FALSE  When you walk around your house or grocery store, do you have to stop and rest due to shortness of breath, chest pain, or light-headedness?   FALSE  Have you had a heart attack, stroke or heart stent placement within the past 6 months?   FALSE  Do you ever use supplemental oxygen?   FALSE  Have you been hospitalized for cirrhosis of the liver or heart failure in the last 12 months?   FALSE  Have you been treated for mouth or throat cancer with radiation or surgery?   FALSE  Have you been told that it is difficult for doctors to insert a breathing tube in you during anesthesia?   FALSE  Have you had a heart or lung transplant?        FALSE  Are you on dialysis?   FALSE  Do you have cirrhosis of the liver?   FALSE  Do you have myasthenia gravis?   FALSE  Is the patient a prisoner?        TRUE  Have you been diagnosed with sleep apnea or do you wear a CPAP machine at night?   FALSE  Are you younger than 30?   FALSE  Have you previously received propofol sedation administered by an anesthesiologist for a GI procedure?   FALSE  Do you drink an average of more than 3 drinks of alcohol per day?   FALSE  Do you regularly take suboxone or any prescription medications for chronic pain?   FALSE  Do you regularly take Ativan, Klonopin, Xanax, Valium, lorazepam, clonazepam,  alprazolam, or diazepam?   FALSE  Have you previously had difficulty with sedation during a GI procedure?   FALSE  Have you been diagnosed with PTSD?   FALSE  Are you allergic to fentanyl or midazolam (Versed)?   FALSE  Do you take medications for HIV?   ################### ###################################################################################################################   MRN:          [id number]   Anticoag Review:  No   Nurse Triage:  No   GI Clinic Consult:  No   Procedure(s):  Colonoscopy     0   [facility]  [facility]     [facility]      [place]          Endoscopist:  Herfarth    Urgent:            No   Prep:               Miralax Prep        ################### ###################################################################################################################

## 2022-04-23 NOTE — Unmapped (Signed)
[facility] Clinical Assessment & Refill Coordination Note    Theodore Mitchell, DOB: [birth year]  Phone: [phone] (home)     All above HIPAA information was verified with patient.     Was a Nurse, learning disability used for this call? No    Specialty Medication(s):   Inflammatory Disorders: Skyrizi     Current Outpatient Medications   Medication Sig Dispense Refill    amitriptyline (ELAVIL) 100 MG tablet Take 1 tablet (100 mg total) by mouth nightly as needed.      cyanocobalamin, vitamin B-12, 2,500 mcg Subl Place 1 tablet (2,500 mcg total) under the tongue daily.      esomeprazole (NEXIUM) 40 MG capsule Take 1 capsule (40 mg total) by mouth daily.      potassium chloride (KLOR-CON) 8 MEQ CR tablet Take 1 tablet (8 mEq total) by mouth Two (2) times a day. Take as instructed by prescribing provider 1 tablet 0    acetaminophen (TYLENOL) 500 MG tablet Take 1-2 tablets (500 mg - 1 g total) by mouth every 6 hours as needed for pain. Prescription not provided, medication is available over the counter. Take as directed on label 1 tablet 0    albuterol HFA 90 mcg/actuation inhaler       colesevelam (WELCHOL) 625 mg tablet Take 3 tablets (1,875 mg total) by mouth in the morning and 3 tablets (1,875 mg total) in the evening. Take with meals. 180 tablet 11    empty container (SHARPS-A-GATOR DISPOSAL SYSTEM) Misc Use as directed 1 each 2    meclizine (ANTIVERT) 25 mg tablet Take 1 tablet (25 mg total) by mouth Three (3) times a day as needed for dizziness.      multivitamin with minerals (HAIR,SKIN AND NAILS) tablet Take 3 tablets by mouth daily. Pt. Dosages is Hair skin and nail 5000 mcg three tablet once a day.      oxyCODONE (ROXICODONE) 5 MG immediate release tablet Take 1 tablet (5 mg total) by mouth every four (4) hours as needed for pain for up to 30 doses. 30 tablet 0    risankizumab-rzaa (SKYRIZI) 360 mg/2.4 mL (150 mg/mL) Injt Inject the contents of 1 cartridge (360 mg total) under the skin every 8 weeks. Maintenance dose. 2.4 mL 2     No current facility-administered medications for this visit.        Changes to medications: Berlon reports no changes at this time.    Allergies   Allergen Reactions    Amoxicillin     Sulfa (Sulfonamide Antibiotics)     Sulfamethoxazole      told by my mother not to take it. unsure if ever given sulfa drugs but patient states he has multiple relatives allergic to sulfa       Changes to allergies: No    SPECIALTY MEDICATION ADHERENCE     Skyrizi  360  mg/2.51mL : 0 days of medicine on hand  Medication Adherence    Patient reported X missed doses in the last month: 0  Specialty Medication: Skyrizi 360mg /2.79mL - 1 q8w  Patient is on additional specialty medications: No  Patient is on more than two specialty medications: No  Any gaps in refill history greater than 2 weeks in the last 3 months: no  Demonstrates understanding of importance of adherence: yes  Informant: patient  Specialty medication(s) dose(s) confirmed: Regimen is correct and unchanged.     Are there any concerns with adherence? No    Adherence counseling provided? Not needed    CLINICAL MANAGEMENT AND INTERVENTION      Clinical Benefit Assessment:    Do you feel the medicine is effective or helping your condition? Yes    Clinical Benefit counseling provided? Not needed    Adverse Effects Assessment:    Are you experiencing any side effects? No    Are you experiencing difficulty administering your medicine? No    Quality of Life Assessment:    Quality of Life    Rheumatology  Oncology  Dermatology  Cystic Fibrosis          How many days over the past month did your crohn's  keep you from your normal activities? For example, brushing your teeth or getting up in the morning. 0    Have you discussed this with your provider? Not needed    Acute Infection Status:    Acute infections noted within Epic:  No active infections  Patient reported infection: None    Therapy Appropriateness:    Is therapy appropriate and patient progressing towards therapeutic goals? Yes, therapy is appropriate and should be continued    DISEASE/MEDICATION-SPECIFIC INFORMATION      For patients on injectable medications: Patient currently has 0 doses left.  Next injection is scheduled for 05/02/2022.    Chronic Inflammatory Diseases: Have you experienced any flares in the last month? No  Has this been reported to your provider? N/A    PATIENT SPECIFIC NEEDS     Does the patient have any physical, cognitive, or cultural barriers? No    Is the patient high risk? No    Did the patient require a clinical intervention? No    Does the patient require physician intervention or other additional services (i.e., nutrition, smoking cessation, social work)? No    SOCIAL DETERMINANTS OF HEALTH     At the [facility], we have learned that life circumstances - like trouble affording food, housing, utilities, or transportation can affect the health of many of our patients.   That is why we wanted to ask: are you currently experiencing any life circumstances that are negatively impacting your health and/or quality of life? Patient declined to answer    Social Determinants of Health     Financial Resource Strain: Not on file   Internet Connectivity: Not on file   Food Insecurity: Not on file   Tobacco Use: Low Risk  (11/30/2021)    Patient History     Smoking Tobacco Use: Never     Smokeless Tobacco Use: Never     Passive Exposure: Not on file   Housing/Utilities: Not on file   Alcohol Use: Not on file   Transportation Needs: Not on file   Substance Use: Not on file   Health Literacy: Not on file   Physical Activity: Not on file   Interpersonal Safety: Not on file   Stress: Not on file   Intimate Partner Violence: Not on file   Depression: Not on file   Social Connections: Not on file       Would you be willing to receive help with any of the needs that you have identified today? Not applicable       SHIPPING Specialty Medication(s) to be Shipped:   Inflammatory Disorders: Skyrizi    Other medication(s) to be shipped: No additional medications requested for fill  at this time     Changes to insurance: No    Delivery Scheduled: Yes, Expected medication delivery date: 04/26/2022.     Medication will be delivered via UPS to the confirmed prescription address in [facility].    The patient will receive a drug information handout for each medication shipped and additional FDA Medication Guides as required.  Verified that patient has previously received a Conservation officer, historic buildings and a Surveyor, mining.    The patient or caregiver noted above participated in the development of this care plan and knows that they can request review of or adjustments to the care plan at any time.      All of the patient's questions and concerns have been addressed.    Teofilo Pod, PharmD   [facility] Specialty Pharmacist

## 2022-04-25 DIAGNOSIS — K50013 Crohn's disease of small intestine with fistula: Principal | ICD-10-CM

## 2022-04-25 MED ORDER — SKYRIZI 360 MG/2.4 ML (150 MG/ML) SUBCUTANEOUS WEARABLE INJECTOR
SUBCUTANEOUS | 2 refills | 56 days | Status: CP
Start: 2022-04-25 — End: ?

## 2022-04-25 NOTE — Unmapped (Signed)
[facility] 's SKYRIZI 360 mg/2.4 mL (150 mg/mL) Injt (risankizumab-rzaa) shipment will be canceled  as a result of no longer being eligible to fill at [facility] pharmacy.     I have reached out to the patient  at [phone] and communicated the delivery change. We will route a message to clinic staff to inform them of this situation We have canceled this work request.

## 2022-04-25 NOTE — Unmapped (Signed)
Per [facility], patient now has to fill Skyrizi with [facility].  Script re-routed today and patient notified.

## 2022-04-25 NOTE — Unmapped (Signed)
The [facility] has received the prescription(s) for SKYRIZI 360 mg/2.4 mL (150 mg/mL) Injt (risankizumab-rzaa). The triage team has completed the benefits investigation and has determined that the patient is NOT able to fill this medication at the [facility] due to insurance plan limitations. Please see additional information below and re-route the prescription to the preferred pharmacy. Thank you.    PA Required: No    Specialty Pharmacy Required:  [facility] - Phone: [phone] and Fax: [phone]

## 2022-04-27 NOTE — Unmapped (Signed)
Specialty Medication(s): [name]    Theodore Mitchell has been dis-enrolled from the [facility] specialty pharmacy services due to a pharmacy change resulting from insurance limitations. The insurance company requires the patient fill at [facility] .    Additional information provided to the patient: [facility] Required:  [facility] - Phone: [phone] and Fax: [phone]    Teofilo Pod, PharmD  [facility] Specialty Pharmacist

## 2022-05-15 MED ORDER — POLYETHYLENE GLYCOL 3350 17 GRAM/DOSE ORAL POWDER
Freq: Once | ORAL | 0 refills | 1.00000 days | Status: CP
Start: 2022-05-15 — End: 2022-05-16

## 2022-05-15 MED ORDER — SIMETHICONE 125 MG CHEWABLE TABLET
ORAL_TABLET | 0 refills | 0 days | Status: CP
Start: 2022-05-15 — End: ?
  Filled 2022-05-16: qty 4, 2d supply, fill #0

## 2022-05-15 MED ORDER — BISACODYL 5 MG TABLET,DELAYED RELEASE
ORAL_TABLET | Freq: Once | ORAL | 0 refills | 1 days | Status: CP
Start: 2022-05-15 — End: 2022-05-16
  Filled 2022-05-16: qty 2, 1d supply, fill #0
  Filled 2022-05-16: qty 119, 1d supply, fill #0
  Filled 2022-05-16: qty 238, 1d supply, fill #0

## 2022-05-30 ENCOUNTER — Ambulatory Visit: Admit: 2022-05-30 | Discharge: 2022-05-30 | Payer: PRIVATE HEALTH INSURANCE

## 2022-05-30 ENCOUNTER — Encounter
Admit: 2022-05-30 | Discharge: 2022-05-30 | Payer: PRIVATE HEALTH INSURANCE | Attending: Certified Registered" | Primary: Certified Registered"

## 2022-05-31 ENCOUNTER — Ambulatory Visit
Admit: 2022-05-31 | Discharge: 2022-06-01 | Payer: PRIVATE HEALTH INSURANCE | Attending: Gastroenterology | Primary: Gastroenterology

## 2022-05-31 DIAGNOSIS — D84821 Immunosuppression due to drug therapy (CMS-HCC): Principal | ICD-10-CM

## 2022-05-31 DIAGNOSIS — Z79899 Other long term (current) drug therapy: Principal | ICD-10-CM

## 2022-05-31 DIAGNOSIS — K50013 Crohn's disease of small intestine with fistula: Principal | ICD-10-CM

## 2022-05-31 MED ORDER — PREVNAR 20 (PF) 0.5 ML INTRAMUSCULAR SYRINGE
Freq: Once | INTRAMUSCULAR | 0 refills | 1 days | Status: CP
Start: 2022-05-31 — End: 2022-05-31

## 2022-06-05 DIAGNOSIS — K50013 Crohn's disease of small intestine with fistula: Principal | ICD-10-CM

## 2022-06-05 MED ORDER — RINVOQ 45 MG TABLET,EXTENDED RELEASE
ORAL_TABLET | Freq: Every day | ORAL | 0 refills | 84 days | Status: CP
Start: 2022-06-05 — End: ?

## 2022-06-05 MED ORDER — UPADACITINIB ER 30 MG TABLET,EXTENDED RELEASE 24 HR
ORAL_TABLET | Freq: Every day | ORAL | 0 refills | 90 days | Status: CP
Start: 2022-06-05 — End: ?

## 2022-09-06 ENCOUNTER — Telehealth
Admit: 2022-09-06 | Discharge: 2022-09-07 | Payer: PRIVATE HEALTH INSURANCE | Attending: Gastroenterology | Primary: Gastroenterology

## 2022-09-06 DIAGNOSIS — E559 Vitamin D deficiency, unspecified: Principal | ICD-10-CM

## 2022-09-06 DIAGNOSIS — Z79899 Other long term (current) drug therapy: Principal | ICD-10-CM

## 2022-09-06 DIAGNOSIS — E611 Iron deficiency: Principal | ICD-10-CM

## 2022-09-06 DIAGNOSIS — D84821 Immunosuppression due to drug therapy (CMS-HCC): Principal | ICD-10-CM

## 2022-09-06 DIAGNOSIS — E538 Deficiency of other specified B group vitamins: Principal | ICD-10-CM

## 2022-09-06 DIAGNOSIS — K50013 Crohn's disease of small intestine with fistula: Principal | ICD-10-CM

## 2022-09-25 MED ORDER — COLESEVELAM 625 MG TABLET
ORAL_TABLET | 0 refills | 0 days
Start: 2022-09-25 — End: ?

## 2022-09-26 MED ORDER — COLESEVELAM 625 MG TABLET
ORAL_TABLET | 4 refills | 0 days | Status: CP
Start: 2022-09-26 — End: ?

## 2022-10-12 MED ORDER — POTASSIUM CHLORIDE ER 8 MEQ TABLET,EXTENDED RELEASE
ORAL_TABLET | Freq: Two times a day (BID) | ORAL | 3 refills | 90 days | Status: CP
Start: 2022-10-12 — End: 2023-10-12

## 2022-10-12 MED ORDER — CHOLECALCIFEROL (VITAMIN D3) 1,250 MCG (50,000 UNIT) CAPSULE
ORAL_CAPSULE | ORAL | 0 refills | 182 days | Status: CP
Start: 2022-10-12 — End: 2023-04-06

## 2022-11-28 DIAGNOSIS — K50013 Crohn's disease of small intestine with fistula: Principal | ICD-10-CM

## 2022-11-28 MED ORDER — UPADACITINIB ER 30 MG TABLET,EXTENDED RELEASE 24 HR
ORAL_TABLET | Freq: Every day | ORAL | 0 refills | 90 days
Start: 2022-11-28 — End: ?

## 2022-11-29 MED ORDER — UPADACITINIB ER 30 MG TABLET,EXTENDED RELEASE 24 HR
ORAL_TABLET | Freq: Every day | ORAL | 0 refills | 90 days | Status: CP
Start: 2022-11-29 — End: ?

## 2022-12-24 ENCOUNTER — Ambulatory Visit: Admit: 2022-12-24 | Discharge: 2022-12-24 | Payer: PRIVATE HEALTH INSURANCE

## 2022-12-24 ENCOUNTER — Encounter: Admit: 2022-12-24 | Discharge: 2022-12-24 | Payer: PRIVATE HEALTH INSURANCE

## 2023-01-03 DIAGNOSIS — K50013 Crohn's disease of small intestine with fistula: Principal | ICD-10-CM

## 2023-01-03 MED ORDER — UPADACITINIB ER 30 MG TABLET,EXTENDED RELEASE 24 HR
ORAL_TABLET | Freq: Every day | ORAL | 0 refills | 90.00000 days | Status: CP
Start: 2023-01-03 — End: 2023-01-03

## 2023-02-02 DIAGNOSIS — K50013 Crohn's disease of small intestine with fistula: Principal | ICD-10-CM

## 2023-02-02 MED ORDER — UPADACITINIB ER 30 MG TABLET,EXTENDED RELEASE 24 HR
ORAL_TABLET | Freq: Every day | ORAL | 0 refills | 90 days
Start: 2023-02-02 — End: ?

## 2023-02-04 MED ORDER — UPADACITINIB ER 30 MG TABLET,EXTENDED RELEASE 24 HR
ORAL_TABLET | Freq: Every day | ORAL | 0 refills | 90 days | Status: CP
Start: 2023-02-04 — End: ?

## 2023-02-06 MED ORDER — COLESEVELAM 625 MG TABLET
ORAL_TABLET | 0 refills | 0 days
Start: 2023-02-06 — End: ?

## 2023-02-11 MED ORDER — COLESEVELAM 625 MG TABLET
ORAL_TABLET | 0 refills | 0 days | Status: CP
Start: 2023-02-11 — End: ?

## 2023-02-27 ENCOUNTER — Ambulatory Visit
Admit: 2023-02-27 | Discharge: 2023-02-28 | Payer: PRIVATE HEALTH INSURANCE | Attending: Gastroenterology | Primary: Gastroenterology

## 2023-02-27 DIAGNOSIS — D84821 Immunosuppression due to drug therapy (CMS-HCC): Principal | ICD-10-CM

## 2023-02-27 DIAGNOSIS — K9089 Other intestinal malabsorption: Principal | ICD-10-CM

## 2023-02-27 DIAGNOSIS — K50013 Crohn's disease of small intestine with fistula: Principal | ICD-10-CM

## 2023-02-27 DIAGNOSIS — Z79899 Other long term (current) drug therapy: Principal | ICD-10-CM

## 2023-02-28 ENCOUNTER — Ambulatory Visit: Admit: 2023-02-28 | Discharge: 2023-03-01 | Payer: PRIVATE HEALTH INSURANCE

## 2023-02-28 DIAGNOSIS — K439 Ventral hernia without obstruction or gangrene: Principal | ICD-10-CM

## 2023-03-04 DIAGNOSIS — K439 Ventral hernia without obstruction or gangrene: Principal | ICD-10-CM

## 2023-03-04 DIAGNOSIS — K219 Gastro-esophageal reflux disease without esophagitis: Principal | ICD-10-CM

## 2023-03-10 MED ORDER — COLESEVELAM 625 MG TABLET
ORAL_TABLET | 0 refills | 0 days
Start: 2023-03-10 — End: ?

## 2023-03-11 MED ORDER — COLESEVELAM 625 MG TABLET
ORAL_TABLET | 3 refills | 0 days | Status: CP
Start: 2023-03-11 — End: ?

## 2023-03-25 DIAGNOSIS — K50013 Crohn's disease of small intestine with fistula: Principal | ICD-10-CM

## 2023-04-08 MED ORDER — CHOLECALCIFEROL (VITAMIN D3) 1,250 MCG (50,000 UNIT) CAPSULE
ORAL_CAPSULE | ORAL | 0 refills | 182 days | Status: CP
Start: 2023-04-08 — End: ?

## 2023-04-11 DIAGNOSIS — K449 Diaphragmatic hernia without obstruction or gangrene: Principal | ICD-10-CM

## 2023-04-12 ENCOUNTER — Ambulatory Visit
Admit: 2023-04-12 | Discharge: 2023-04-13 | Payer: MEDICARE | Attending: Student in an Organized Health Care Education/Training Program | Primary: Student in an Organized Health Care Education/Training Program

## 2023-04-12 DIAGNOSIS — K439 Ventral hernia without obstruction or gangrene: Principal | ICD-10-CM

## 2023-04-12 DIAGNOSIS — K449 Diaphragmatic hernia without obstruction or gangrene: Principal | ICD-10-CM

## 2023-04-12 DIAGNOSIS — K219 Gastro-esophageal reflux disease without esophagitis: Principal | ICD-10-CM

## 2023-04-23 ENCOUNTER — Ambulatory Visit: Admit: 2023-04-23 | Discharge: 2023-04-23 | Payer: MEDICARE

## 2023-04-24 ENCOUNTER — Ambulatory Visit: Admit: 2023-04-24 | Discharge: 2023-04-24 | Payer: MEDICARE

## 2023-04-25 ENCOUNTER — Ambulatory Visit: Admit: 2023-04-25 | Discharge: 2023-04-26 | Payer: PRIVATE HEALTH INSURANCE

## 2023-04-30 ENCOUNTER — Ambulatory Visit: Admit: 2023-04-30 | Discharge: 2023-05-01 | Payer: PRIVATE HEALTH INSURANCE

## 2023-05-20 ENCOUNTER — Ambulatory Visit: Admit: 2023-05-20 | Payer: BLUE CROSS/BLUE SHIELD

## 2023-05-20 ENCOUNTER — Encounter: Admit: 2023-05-20 | Payer: PRIVATE HEALTH INSURANCE

## 2023-05-20 ENCOUNTER — Ambulatory Visit
Admit: 2023-05-20 | Discharge: 2023-05-28 | Disposition: A | Discharge status: Home / Self Care | Payer: PRIVATE HEALTH INSURANCE

## 2023-05-20 ENCOUNTER — Ambulatory Visit
Admit: 2023-05-20 | Discharge: 2023-05-28 | Disposition: A | Discharge status: Home / Self Care | Payer: BLUE CROSS/BLUE SHIELD

## 2023-05-24 MED ORDER — UPADACITINIB 1 MG/ML ORAL SOLUTION
ORAL | 0 refills | 0.00000 days
Start: 2023-05-24 — End: ?

## 2023-05-24 MED ORDER — ONDANSETRON 4 MG DISINTEGRATING TABLET
ORAL_TABLET | Freq: Three times a day (TID) | 0 refills | 5.00000 days | PRN
Start: 2023-05-24 — End: 2023-05-31

## 2023-05-24 MED ORDER — OXYCODONE 5 MG/5 ML ORAL SOLUTION
Freq: Four times a day (QID) | ORAL | 0 refills | 3.00000 days | PRN
Start: 2023-05-24 — End: 2023-05-29

## 2023-05-24 MED ORDER — ACETAMINOPHEN 650 MG/20.3 ML ORAL LIQUID WRAPPER
Freq: Four times a day (QID) | ORAL | 0 refills | 6.00000 days | PRN
Start: 2023-05-24 — End: ?

## 2023-05-24 MED ORDER — GABAPENTIN 300 MG/6 ML (6 ML) ORAL SOLUTION
Freq: Three times a day (TID) | ORAL | 0 refills | 30.00000 days | PRN
Start: 2023-05-24 — End: 2023-06-23

## 2023-05-27 MED ORDER — ACETAMINOPHEN 160 MG/5 ML ORAL LIQUID
Freq: Four times a day (QID) | ORAL | 0 refills | 6.00000 days | Status: CP
Start: 2023-05-27 — End: ?
  Filled 2023-05-28: qty 473, 6d supply, fill #0

## 2023-05-27 MED ORDER — SIMETHICONE 80 MG CHEWABLE TABLET
ORAL_TABLET | Freq: Four times a day (QID) | ORAL | 0 refills | 25.00000 days | Status: CP | PRN
Start: 2023-05-27 — End: 2023-06-26
  Filled 2023-05-28: qty 100, 25d supply, fill #0

## 2023-05-27 MED ORDER — OXYCODONE 5 MG/5 ML ORAL SOLUTION
Freq: Four times a day (QID) | ORAL | 0 refills | 3.00000 days | Status: CP | PRN
Start: 2023-05-27 — End: 2023-06-01
  Filled 2023-05-28: qty 50, 3d supply, fill #0

## 2023-05-27 MED ORDER — GABAPENTIN 250 MG/5 ML ORAL SOLUTION
Freq: Three times a day (TID) | ORAL | 0 refills | 30.00000 days | Status: CP | PRN
Start: 2023-05-27 — End: 2023-06-26
  Filled 2023-05-28: qty 540, 30d supply, fill #0

## 2023-05-27 MED ORDER — ONDANSETRON 4 MG DISINTEGRATING TABLET
ORAL_TABLET | Freq: Three times a day (TID) | 0 refills | 5.00000 days | Status: CP | PRN
Start: 2023-05-27 — End: 2023-06-03
  Filled 2023-05-28: qty 15, 5d supply, fill #0

## 2023-06-06 ENCOUNTER — Ambulatory Visit: Admit: 2023-06-06 | Discharge: 2023-06-07 | Payer: BLUE CROSS/BLUE SHIELD

## 2023-06-07 ENCOUNTER — Ambulatory Visit
Admit: 2023-06-07 | Discharge: 2023-06-08 | Payer: BLUE CROSS/BLUE SHIELD | Attending: Student in an Organized Health Care Education/Training Program | Primary: Student in an Organized Health Care Education/Training Program

## 2023-06-07 MED ORDER — CHOLESTYRAMINE (WITH SUGAR) 4 GRAM POWDER FOR SUSP IN A PACKET
Freq: Three times a day (TID) | ORAL | 0 refills | 20 days | Status: CP | PRN
Start: 2023-06-07 — End: 2024-06-06

## 2023-06-17 DIAGNOSIS — K50013 Crohn's disease of small intestine with fistula: Principal | ICD-10-CM

## 2023-07-09 DIAGNOSIS — K50013 Crohn's disease of small intestine with fistula: Principal | ICD-10-CM

## 2023-07-09 MED ORDER — CHOLESTYRAMINE (WITH SUGAR) 4 GRAM POWDER FOR SUSP IN A PACKET
Freq: Three times a day (TID) | ORAL | 0 refills | 20.00 days | PRN
Start: 2023-07-09 — End: 2024-07-08

## 2023-07-09 MED ORDER — UPADACITINIB ER 30 MG TABLET,EXTENDED RELEASE 24 HR
ORAL_TABLET | Freq: Every day | ORAL | 0 refills | 90.00 days | Status: CP
Start: 2023-07-09 — End: ?

## 2023-07-11 MED ORDER — CHOLESTYRAMINE (WITH SUGAR) 4 GRAM POWDER FOR SUSP IN A PACKET
0 refills | 0.00 days
Start: 2023-07-11 — End: ?

## 2023-08-28 DIAGNOSIS — K50013 Crohn's disease of small intestine with fistula: Principal | ICD-10-CM

## 2023-08-28 MED ORDER — RINVOQ 30 MG TABLET,EXTENDED RELEASE
ORAL_TABLET | Freq: Every day | ORAL | 0 refills | 0.00 days
Start: 2023-08-28 — End: ?

## 2023-08-29 MED ORDER — RINVOQ 30 MG TABLET,EXTENDED RELEASE
ORAL_TABLET | Freq: Every day | ORAL | 0 refills | 90.00 days | Status: CP
Start: 2023-08-29 — End: ?

## 2023-09-09 DIAGNOSIS — K50013 Crohn's disease of small intestine with fistula: Principal | ICD-10-CM

## 2023-10-09 DIAGNOSIS — K50013 Crohn's disease of small intestine with fistula: Principal | ICD-10-CM

## 2023-10-09 MED ORDER — CHOLESTYRAMINE (WITH SUGAR) 4 GRAM POWDER FOR SUSP IN A PACKET
0 refills | 0.00 days
Start: 2023-10-09 — End: ?

## 2023-10-23 DIAGNOSIS — K50013 Crohn's disease of small intestine with fistula: Principal | ICD-10-CM

## 2023-10-23 MED ORDER — RINVOQ 45 MG TABLET,EXTENDED RELEASE
ORAL_TABLET | Freq: Every day | ORAL | 0 refills | 84 days | Status: CP
Start: 2023-10-23 — End: 2024-01-15

## 2023-10-24 ENCOUNTER — Ambulatory Visit: Admit: 2023-10-24 | Discharge: 2023-10-24

## 2023-10-24 ENCOUNTER — Ambulatory Visit: Admit: 2023-10-24 | Discharge: 2023-10-24 | Attending: Gastroenterology | Primary: Gastroenterology

## 2023-10-24 DIAGNOSIS — Z9889 Other specified postprocedural states: Principal | ICD-10-CM

## 2023-10-24 DIAGNOSIS — Z8719 Personal history of other diseases of the digestive system: Principal | ICD-10-CM

## 2024-01-01 DIAGNOSIS — K50013 Crohn's disease of small intestine with fistula: Principal | ICD-10-CM

## 2024-01-01 MED ORDER — UPADACITINIB ER 30 MG TABLET,EXTENDED RELEASE 24 HR
ORAL_TABLET | Freq: Every day | ORAL | 0 refills | 90.00000 days | Status: CP
Start: 2024-01-01 — End: ?

## 2024-01-09 DIAGNOSIS — K50013 Crohn's disease of small intestine with fistula: Principal | ICD-10-CM

## 2024-01-09 MED ORDER — UPADACITINIB ER 30 MG TABLET,EXTENDED RELEASE 24 HR
ORAL_TABLET | Freq: Every day | ORAL | 3 refills | 90.00000 days | Status: CP
Start: 2024-01-09 — End: 2025-01-08

## 2024-01-17 DIAGNOSIS — K50013 Crohn's disease of small intestine with fistula: Principal | ICD-10-CM

## 2024-01-17 MED ORDER — RINVOQ 45 MG TABLET,EXTENDED RELEASE
ORAL_TABLET | Freq: Every day | ORAL | 0 refills | 84.00000 days | Status: CP
Start: 2024-01-17 — End: ?

## 2024-02-10 ENCOUNTER — Inpatient Hospital Stay: Admit: 2024-02-10 | Discharge: 2024-02-10 | Payer: BLUE CROSS/BLUE SHIELD

## 2024-02-10 ENCOUNTER — Encounter: Admit: 2024-02-10 | Discharge: 2024-02-10 | Payer: BLUE CROSS/BLUE SHIELD | Attending: Specialist | Primary: Specialist

## 2024-02-12 ENCOUNTER — Encounter
Admit: 2024-02-12 | Discharge: 2024-02-13 | Payer: BLUE CROSS/BLUE SHIELD | Attending: Gastroenterology | Primary: Gastroenterology

## 2024-02-12 DIAGNOSIS — E559 Vitamin D deficiency, unspecified: Principal | ICD-10-CM

## 2024-02-12 DIAGNOSIS — K9089 Other intestinal malabsorption: Principal | ICD-10-CM

## 2024-02-12 DIAGNOSIS — E611 Iron deficiency: Principal | ICD-10-CM

## 2024-02-12 DIAGNOSIS — E538 Deficiency of other specified B group vitamins: Principal | ICD-10-CM

## 2024-02-12 DIAGNOSIS — K50013 Crohn's disease of small intestine with fistula: Principal | ICD-10-CM

## 2024-02-12 DIAGNOSIS — D84821 Immunosuppression due to drug therapy (HHS-HCC): Principal | ICD-10-CM

## 2024-02-12 DIAGNOSIS — Z79899 Other long term (current) drug therapy: Principal | ICD-10-CM

## 2024-02-13 DIAGNOSIS — K50013 Crohn's disease of small intestine with fistula: Principal | ICD-10-CM

## 2024-02-13 MED ORDER — RINVOQ 45 MG TABLET,EXTENDED RELEASE
ORAL_TABLET | Freq: Every day | ORAL | 0 refills | 84.00000 days | Status: CP
Start: 2024-02-13 — End: ?

## 2024-02-19 MED ORDER — RINVOQ 45 MG TABLET,EXTENDED RELEASE
ORAL_TABLET | Freq: Every day | ORAL | 1 refills | 84.00000 days | Status: CP
Start: 2024-02-19 — End: ?

## 2024-03-27 MED ORDER — HYDROCORTISONE 1 %-PRAMOXINE 1 % RECTAL FOAM
Freq: Two times a day (BID) | RECTAL | 2 refills | 27.00000 days | Status: CP
Start: 2024-03-27 — End: 2025-03-27

## 2024-03-30 DIAGNOSIS — K50013 Crohn's disease of small intestine with fistula: Principal | ICD-10-CM

## 2024-03-30 MED ORDER — CIPROFLOXACIN 500 MG TABLET
ORAL_TABLET | Freq: Two times a day (BID) | ORAL | 0 refills | 14.00000 days | Status: CP
Start: 2024-03-30 — End: 2024-04-13

## 2024-04-22 ENCOUNTER — Inpatient Hospital Stay: Admit: 2024-04-22 | Discharge: 2024-04-22 | Payer: BLUE CROSS/BLUE SHIELD

## 2024-04-22 ENCOUNTER — Ambulatory Visit
Admit: 2024-04-22 | Discharge: 2024-04-22 | Payer: BLUE CROSS/BLUE SHIELD | Attending: Gastroenterology | Primary: Gastroenterology

## 2024-04-22 DIAGNOSIS — D84821 Immunosuppression due to drug therapy (HHS-HCC): Principal | ICD-10-CM

## 2024-04-22 DIAGNOSIS — Z23 Encounter for immunization: Principal | ICD-10-CM

## 2024-04-22 DIAGNOSIS — Z79899 Other long term (current) drug therapy: Principal | ICD-10-CM

## 2024-04-22 DIAGNOSIS — K9089 Other intestinal malabsorption: Principal | ICD-10-CM

## 2024-04-22 DIAGNOSIS — K50013 Crohn's disease of small intestine with fistula: Principal | ICD-10-CM

## 2024-04-22 MED ORDER — CIPROFLOXACIN 500 MG TABLET
ORAL_TABLET | Freq: Two times a day (BID) | ORAL | 0 refills | 30.00000 days | Status: CP
Start: 2024-04-22 — End: 2024-05-22

## 2024-05-14 DIAGNOSIS — K50013 Crohn's disease of small intestine with fistula: Principal | ICD-10-CM

## 2024-05-14 MED ORDER — UPADACITINIB ER 30 MG TABLET,EXTENDED RELEASE 24 HR
ORAL_TABLET | Freq: Every day | ORAL | 0 refills | 90.00000 days | Status: CP
Start: 2024-05-14 — End: ?

## 2024-05-26 ENCOUNTER — Ambulatory Visit: Admit: 2024-05-26 | Discharge: 2024-05-27 | Payer: BLUE CROSS/BLUE SHIELD

## 2024-05-28 DIAGNOSIS — K603 Anal fistula: Principal | ICD-10-CM

## 2024-06-04 ENCOUNTER — Encounter: Admit: 2024-06-04 | Discharge: 2024-06-04 | Payer: BLUE CROSS/BLUE SHIELD

## 2024-06-04 ENCOUNTER — Inpatient Hospital Stay: Admit: 2024-06-04 | Discharge: 2024-06-04 | Payer: BLUE CROSS/BLUE SHIELD

## 2024-06-04 MED ORDER — ACETAMINOPHEN 325 MG TABLET
ORAL_TABLET | Freq: Four times a day (QID) | ORAL | 0 refills | 3.00000 days | Status: CP | PRN
Start: 2024-06-04 — End: ?

## 2024-07-07 ENCOUNTER — Ambulatory Visit: Admit: 2024-07-07 | Discharge: 2024-07-08 | Payer: BLUE CROSS/BLUE SHIELD

## 2024-07-30 ENCOUNTER — Ambulatory Visit
Admit: 2024-07-30 | Discharge: 2024-07-31 | Payer: BLUE CROSS/BLUE SHIELD | Attending: Gastroenterology | Primary: Gastroenterology

## 2024-07-30 DIAGNOSIS — K9089 Other intestinal malabsorption: Principal | ICD-10-CM

## 2024-07-30 DIAGNOSIS — L03317 Cellulitis of buttock: Principal | ICD-10-CM

## 2024-07-30 DIAGNOSIS — K50013 Crohn's disease of small intestine with fistula: Principal | ICD-10-CM

## 2024-07-30 DIAGNOSIS — Z7969 Long term current use of upadacitinib: Principal | ICD-10-CM

## 2024-07-30 DIAGNOSIS — K9049 Malabsorption due to intolerance, not elsewhere classified: Principal | ICD-10-CM

## 2024-07-30 MED ORDER — LEVOFLOXACIN 500 MG TABLET
ORAL_TABLET | Freq: Every day | ORAL | 0 refills | 28.00000 days | Status: CP
Start: 2024-07-30 — End: 2024-08-27

## 2024-07-30 MED ORDER — TRAMADOL 50 MG TABLET
ORAL_TABLET | Freq: Two times a day (BID) | ORAL | 0 refills | 8.00000 days | Status: CP | PRN
Start: 2024-07-30 — End: 2024-07-30

## 2024-07-31 DIAGNOSIS — L03317 Cellulitis of buttock: Principal | ICD-10-CM

## 2024-07-31 MED ORDER — LEVOFLOXACIN 500 MG TABLET
ORAL_TABLET | Freq: Every day | ORAL | 0 refills | 28.00000 days | Status: CP
Start: 2024-07-31 — End: 2024-08-28

## 2024-07-31 MED ORDER — TRAMADOL 50 MG TABLET
ORAL_TABLET | Freq: Three times a day (TID) | ORAL | 0 refills | 17.00000 days | Status: CP | PRN
Start: 2024-07-31 — End: ?

## 2024-08-03 DIAGNOSIS — L03317 Cellulitis of buttock: Principal | ICD-10-CM

## 2024-08-03 MED ORDER — AMOXICILLIN 875 MG-POTASSIUM CLAVULANATE 125 MG TABLET
ORAL_TABLET | Freq: Two times a day (BID) | ORAL | 0 refills | 6.00000 days | Status: CP
Start: 2024-08-03 — End: 2024-08-09

## 2024-08-04 DIAGNOSIS — L03317 Cellulitis of buttock: Principal | ICD-10-CM

## 2024-08-04 MED ORDER — AMOXICILLIN 875 MG-POTASSIUM CLAVULANATE 125 MG TABLET
ORAL_TABLET | Freq: Two times a day (BID) | ORAL | 0 refills | 6.00000 days | Status: CP
Start: 2024-08-04 — End: 2024-08-10

## 2024-08-05 DIAGNOSIS — L03317 Cellulitis of buttock: Principal | ICD-10-CM

## 2024-08-11 DIAGNOSIS — K50013 Crohn's disease of small intestine with fistula: Principal | ICD-10-CM

## 2024-08-11 MED ORDER — RINVOQ 45 MG TABLET,EXTENDED RELEASE
ORAL_TABLET | Freq: Every day | ORAL | 0 refills | 90.00000 days | Status: CP
Start: 2024-08-11 — End: ?

## 2024-08-14 DIAGNOSIS — K50013 Crohn's disease of small intestine with fistula: Principal | ICD-10-CM

## 2024-08-14 MED ORDER — OXYCODONE-ACETAMINOPHEN 5 MG-325 MG TABLET
ORAL_TABLET | ORAL | 0 refills | 5.00000 days | Status: CP | PRN
Start: 2024-08-14 — End: 2024-08-19

## 2024-08-14 MED ORDER — AMOXICILLIN 875 MG-POTASSIUM CLAVULANATE 125 MG TABLET
ORAL_TABLET | Freq: Two times a day (BID) | ORAL | 0 refills | 30.00000 days | Status: CP
Start: 2024-08-14 — End: 2024-09-13
